# Patient Record
Sex: Male | Born: 1960 | Race: Black or African American | Hispanic: No | Marital: Married | Smoking: Never smoker
Health system: Southern US, Community
[De-identification: ages and names within clinical notes are randomized; demographics above are authoritative.]

## PROBLEM LIST (undated history)

## (undated) DIAGNOSIS — I82409 Acute embolism and thrombosis of unspecified deep veins of unspecified lower extremity: Secondary | ICD-10-CM

## (undated) DIAGNOSIS — M2140 Flat foot [pes planus] (acquired), unspecified foot: Secondary | ICD-10-CM

## (undated) DIAGNOSIS — I10 Essential (primary) hypertension: Secondary | ICD-10-CM

## (undated) HISTORY — DX: Essential (primary) hypertension: I10

## (undated) HISTORY — DX: Flat foot (pes planus) (acquired), unspecified foot: M21.40

## (undated) HISTORY — DX: Acute embolism and thrombosis of unspecified deep veins of unspecified lower extremity: I82.409

## (undated) HISTORY — PX: HALLUX VALGUS CORRECTION: SUR315

---

## 1986-01-12 HISTORY — PX: OTHER SURGICAL HISTORY: SHX169

## 1997-01-12 HISTORY — PX: KNEE ARTHROSCOPY: SUR90

## 2010-06-18 ENCOUNTER — Ambulatory Visit: Payer: Self-pay | Admitting: Internal Medicine

## 2012-07-04 DIAGNOSIS — M549 Dorsalgia, unspecified: Secondary | ICD-10-CM | POA: Insufficient documentation

## 2012-07-04 DIAGNOSIS — I10 Essential (primary) hypertension: Secondary | ICD-10-CM | POA: Insufficient documentation

## 2013-02-14 HISTORY — PX: OTHER SURGICAL HISTORY: SHX169

## 2015-04-18 ENCOUNTER — Other Ambulatory Visit: Payer: Self-pay | Admitting: Family Medicine

## 2015-04-18 ENCOUNTER — Ambulatory Visit
Admission: RE | Admit: 2015-04-18 | Discharge: 2015-04-18 | Disposition: A | Discharge status: Home / Self Care | Payer: PRIVATE HEALTH INSURANCE | Source: Ambulatory Visit | Attending: Family Medicine | Admitting: Family Medicine

## 2015-04-18 DIAGNOSIS — R52 Pain, unspecified: Secondary | ICD-10-CM

## 2015-04-18 DIAGNOSIS — M7989 Other specified soft tissue disorders: Secondary | ICD-10-CM

## 2015-04-18 DIAGNOSIS — I82431 Acute embolism and thrombosis of right popliteal vein: Secondary | ICD-10-CM | POA: Diagnosis not present

## 2015-04-30 ENCOUNTER — Ambulatory Visit
Admission: RE | Admit: 2015-04-30 | Discharge: 2015-04-30 | Disposition: A | Discharge status: Home / Self Care | Payer: PRIVATE HEALTH INSURANCE | Source: Ambulatory Visit | Attending: Family Medicine | Admitting: Family Medicine

## 2015-04-30 ENCOUNTER — Other Ambulatory Visit: Payer: Self-pay | Admitting: Family Medicine

## 2015-04-30 ENCOUNTER — Other Ambulatory Visit
Admission: RE | Admit: 2015-04-30 | Discharge: 2015-04-30 | Disposition: A | Discharge status: Home / Self Care | Payer: PRIVATE HEALTH INSURANCE | Source: Ambulatory Visit | Attending: Family Medicine | Admitting: Family Medicine

## 2015-04-30 DIAGNOSIS — I824Y1 Acute embolism and thrombosis of unspecified deep veins of right proximal lower extremity: Secondary | ICD-10-CM

## 2015-04-30 LAB — COMPREHENSIVE METABOLIC PANEL
ALBUMIN: 4.1 g/dL (ref 3.5–5.0)
ALK PHOS: 45 U/L (ref 38–126)
ALT: 25 U/L (ref 17–63)
ANION GAP: 6 (ref 5–15)
AST: 28 U/L (ref 15–41)
BUN: 13 mg/dL (ref 6–20)
CALCIUM: 8.6 mg/dL — AB (ref 8.9–10.3)
CO2: 25 mmol/L (ref 22–32)
Chloride: 102 mmol/L (ref 101–111)
Creatinine, Ser: 1.05 mg/dL (ref 0.61–1.24)
GFR calc Af Amer: >60 mL/min (ref >60)
GFR calc non Af Amer: >60 mL/min (ref >60)
GLUCOSE: 138 mg/dL — AB (ref 65–99)
Potassium: 2.9 mmol/L — CL (ref 3.5–5.1)
SODIUM: 133 mmol/L — AB (ref 135–145)
Total Bilirubin: 1 mg/dL (ref 0.3–1.2)
Total Protein: 7.7 g/dL (ref 6.5–8.1)

## 2015-04-30 LAB — CBC
HEMATOCRIT: 41.7 % (ref 40.0–52.0)
HEMOGLOBIN: 14.3 g/dL (ref 13.0–18.0)
MCH: 28.3 pg (ref 26.0–34.0)
MCHC: 34.3 g/dL (ref 32.0–36.0)
MCV: 82.4 fL (ref 80.0–100.0)
Platelets: 218 10*3/uL (ref 150–440)
RBC: 5.06 MIL/uL (ref 4.40–5.90)
RDW: 13.3 % (ref 11.5–14.5)
WBC: 5.7 10*3/uL (ref 3.8–10.6)

## 2015-04-30 LAB — LIPID PANEL
CHOL/HDL RATIO: 3.8 ratio
Cholesterol: 151 mg/dL (ref 0–200)
HDL: 40 mg/dL — AB (ref >40)
LDL CALC: 103 mg/dL — AB (ref 0–99)
Triglycerides: 40 mg/dL (ref <150)
VLDL: 8 mg/dL (ref 0–40)

## 2015-05-06 LAB — PROTHROMBIN GENE MUTATION

## 2015-05-06 LAB — ANTIPHOSPHOLIPID SYNDROME PROF
Anticardiolipin IgG: <9 GPL U/mL (ref 0–14)
DRVVT: 83.4 s — AB (ref 0.0–44.0)
PTT LA: 39 s (ref 0.0–43.6)

## 2015-05-06 LAB — FACTOR 5 LEIDEN

## 2015-05-06 LAB — ANA W/REFLEX IF POSITIVE: Anti Nuclear Antibody(ANA): NEGATIVE

## 2015-05-06 LAB — DRVVT CONFIRM: dRVVT Confirm: 1.5 ratio — ABNORMAL HIGH (ref 0.8–1.2)

## 2015-05-06 LAB — DRVVT MIX: DRVVT MIX: 59.3 s — AB (ref 0.0–44.0)

## 2015-09-30 ENCOUNTER — Encounter (INDEPENDENT_AMBULATORY_CARE_PROVIDER_SITE_OTHER): Payer: Self-pay

## 2015-09-30 ENCOUNTER — Inpatient Hospital Stay: Payer: PRIVATE HEALTH INSURANCE | Attending: Internal Medicine | Admitting: Internal Medicine

## 2015-09-30 DIAGNOSIS — Z7901 Long term (current) use of anticoagulants: Secondary | ICD-10-CM | POA: Diagnosis not present

## 2015-09-30 DIAGNOSIS — Z7982 Long term (current) use of aspirin: Secondary | ICD-10-CM

## 2015-09-30 DIAGNOSIS — D6851 Activated protein C resistance: Secondary | ICD-10-CM | POA: Diagnosis not present

## 2015-09-30 DIAGNOSIS — Z86718 Personal history of other venous thrombosis and embolism: Secondary | ICD-10-CM | POA: Diagnosis not present

## 2015-09-30 DIAGNOSIS — Z79899 Other long term (current) drug therapy: Secondary | ICD-10-CM | POA: Diagnosis not present

## 2015-09-30 DIAGNOSIS — I10 Essential (primary) hypertension: Secondary | ICD-10-CM

## 2015-09-30 DIAGNOSIS — I82431 Acute embolism and thrombosis of right popliteal vein: Secondary | ICD-10-CM | POA: Insufficient documentation

## 2015-09-30 DIAGNOSIS — I82401 Acute embolism and thrombosis of unspecified deep veins of right lower extremity: Secondary | ICD-10-CM | POA: Insufficient documentation

## 2015-09-30 NOTE — Progress Notes (Signed)
Patient ambulates without assistance, brought to exam 17.  Patient denies pain or discomfort at this time.  BP 162/81 HR 69, vitals documented

## 2015-09-30 NOTE — Progress Notes (Signed)
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     HISTORY OF PRESENTING ILLNESS:  Brandon Carr 55 y.o.  male with no prior history of blood clots a family history of blood clots- noted to have swelling and pain in his right lower extremity- April 2017 ultrasound of the lower leg showed DVT right popliteal/femoral vein. Patient was treated with Xarelto for about 3 months. He has been referred to [place] for an abnormal antiphospholipid antibody testing.  Patient does not cite any immobility risk factors relating to the DVT.  ROS: A complete 10 point review of system is done which is negative except mentioned above in history of present illness  MEDICAL HISTORY:  Past Medical History:  Diagnosis Date  . Acute DVT (deep venous thrombosis) (HCC)   . Hypertension   . Pes planus     SURGICAL HISTORY: Past Surgical History:  Procedure Laterality Date  . COLONOSCOPY W/BIOPSY  02/14/2013   Procedure: SCREENING COLONOSCOPY; Surgeon: Jonny Ruiz, MD; Location: [facility]; Service: Gastroenterology; Laterality:  . HALLUX VALGUS CORRECTION Left    Now with residual numbness at the lerft great toe  . KNEE ARTHROSCOPY Left 1999  . REPAIR EXTENSOR TENDON FOOT Right 1988    SOCIAL HISTORY: never smoked; no alcohol; desk job/ware house; no alcohol; in [place].  Social History   Social History  . Marital status: Married    Spouse name: N/A  . Number of children: N/A  . Years of education: N/A   Occupational History  . Not on file.   Social History Main Topics  . Smoking status: Never Smoker  . Smokeless tobacco: Never Used  . Alcohol use No  . Drug use: No  . Sexual activity: Not on file   Other Topics Concern   . Not on file   Social History Narrative  . No narrative on file    FAMILY HISTORY: No family hx of blood clots; sister-at ~ mid 40 breast cancer.  Family History  Problem Relation Age of Onset  . Stroke Father   . Breast cancer Sister     ALLERGIES:  has no allergies on file.  MEDICATIONS:  Current Outpatient Prescriptions  Medication Sig Dispense Refill  . losartan (COZAAR) 25 MG tablet 25 mg daily.    . meloxicam (MOBIC) 15 MG tablet Take 15 mg by mouth daily.    Marland Kitchen aspirin EC 81 MG tablet Take 81 mg by mouth.     No current facility-administered medications for this visit.       Marland Kitchen  PHYSICAL EXAMINATION: ECOG PERFORMANCE STATUS: 0 - Asymptomatic  Vitals:   09/30/15 1436  BP: (!) 162/81  Pulse: 69  Temp: 98.5 F (36.9 C)   Filed Weights   09/30/15 1436  Weight: 264 lb 6.4 oz (119.9 kg)    GENERAL: Well-nourished well-developed; Alert, no distress and comfortable.   Alone.  EYES: no pallor or icterus OROPHARYNX: no thrush or ulceration; good dentition  NECK: supple, no masses felt LYMPH:  no palpable lymphadenopathy in the cervical, axillary or inguinal regions LUNGS: clear to auscultation and  No wheeze or crackles HEART/CVS: regular rate & rhythm and no murmurs; Right claf slight more swollen than the left lower extremity.  ABDOMEN:  abdomen soft, non-tender and normal bowel sounds Musculoskeletal:no cyanosis of digits and no clubbing  PSYCH: alert & oriented x 3 with fluent speech NEURO: no focal motor/sensory deficits SKIN:  no rashes or significant lesions  LABORATORY DATA:  I have reviewed the data as listed Lab Results  Component Value Date   WBC 5.7 04/30/2015   HGB 14.3 04/30/2015   HCT 41.7 04/30/2015   MCV 82.4 04/30/2015   PLT 218 04/30/2015    Recent Labs  04/30/15 0811  NA 133*  K 2.9*  CL 102  CO2 25  GLUCOSE 138*  BUN 13  CREATININE 1.05  CALCIUM 8.6*  GFRNONAA >60  GFRAA >60  PROT 7.7  ALBUMIN 4.1  AST 28  ALT 25   ALKPHOS 45  BILITOT 1.0    RADIOGRAPHIC STUDIES: I have personally reviewed the radiological images as listed and agreed with the findings in the report. No results found.  ASSESSMENT & PLAN:   Acute deep vein thrombosis (DVT) of popliteal vein of right lower extremity (HCC) Acute DVT of the right lower extremity status post 3 months of anticoagulation [finished approximately July 2017]. Clinically no evidence of recurrence/progression. No evidence of any post thrombophilic syndrome. However given the elevated d-dimer off anticoagulation; I would recommend a total of 6 months of anticoagulation. This was personally discussed with Dr. Ellin Goodieabinowitz.   # Recommend follow-up with PCP; no further follow-ups at this time.   Thank you Dr. Ellin Goodieabinowitz for allowing me to participate in the care of your pleasant patient. Please do not hesitate to contact me with questions or concerns in the interim.  All questions were answered. The patient knows to call the clinic with any problems, questions or concerns.     Earna CoderGovinda R Brahmanday, MD 10/01/2015 7:58 PM

## 2015-09-30 NOTE — Assessment & Plan Note (Addendum)
Acute DVT of the right lower extremity status post 3 months of anticoagulation [finished approximately July 2017]. Clinically no evidence of recurrence/progression. No evidence of any post thrombophilic syndrome. However given the elevated d-dimer off anticoagulation; I would recommend a total of 6 months of anticoagulation. This was personally discussed with Dr. Ellin Goodieabinowitz.   # Abnormal antiphospholipid antibody testing on anticoagulation; repeat testing And decortication within normal limits. The previous test was likely a false positive.   # Recommend follow-up with PCP; no further follow-ups at this time.   Thank you Dr. Ellin Goodieabinowitz for allowing me to participate in the care of your pleasant patient. Please do not hesitate to contact me with questions or concerns in the interim.

## 2015-10-01 ENCOUNTER — Telehealth: Payer: Self-pay | Admitting: Internal Medicine

## 2015-10-01 ENCOUNTER — Encounter: Payer: Self-pay | Admitting: *Deleted

## 2015-10-01 NOTE — Telephone Encounter (Signed)
Heather- please inform patient that I have spoken to his PCP Dr.rabinowitz; recommend 3 more months of Xarelto [total of 6 months]. To call his PCPs office for a refill if needed office for xarelto. Stop aspirin while on Xarelto. Recommend continued follow up with PCP; no follow-up with us.

## 2015-10-02 NOTE — Telephone Encounter (Signed)
Attempted to contact patient- no answer. Left vm to call cancer ctr.

## 2015-10-02 NOTE — Telephone Encounter (Signed)
Pt returned Brandon Treptow,Rn's phone call. instructions provided to patient. pcp has already touched base with patient and provided recommendation.

## 2015-10-02 NOTE — Telephone Encounter (Signed)
Attempted to call patient again. No answer. Left vm for patient to call cancer center to discuss his care with RN

## 2017-02-28 IMAGING — CR DG CHEST 2V
1 series · 2 of 2 positions shown · non-contrast
Comparison: 05/22/2003 by report only

CLINICAL DATA: Pt recently diagnosed with DVT of right leg, [REDACTED] to rule out mass, nonsmoker, no heart and lung surgery

EXAM:
CHEST - 2 VIEW

[Series 1: dg chest 2 view · 0.14mm/px · 2 of 2 slices shown]
[im 1/2]
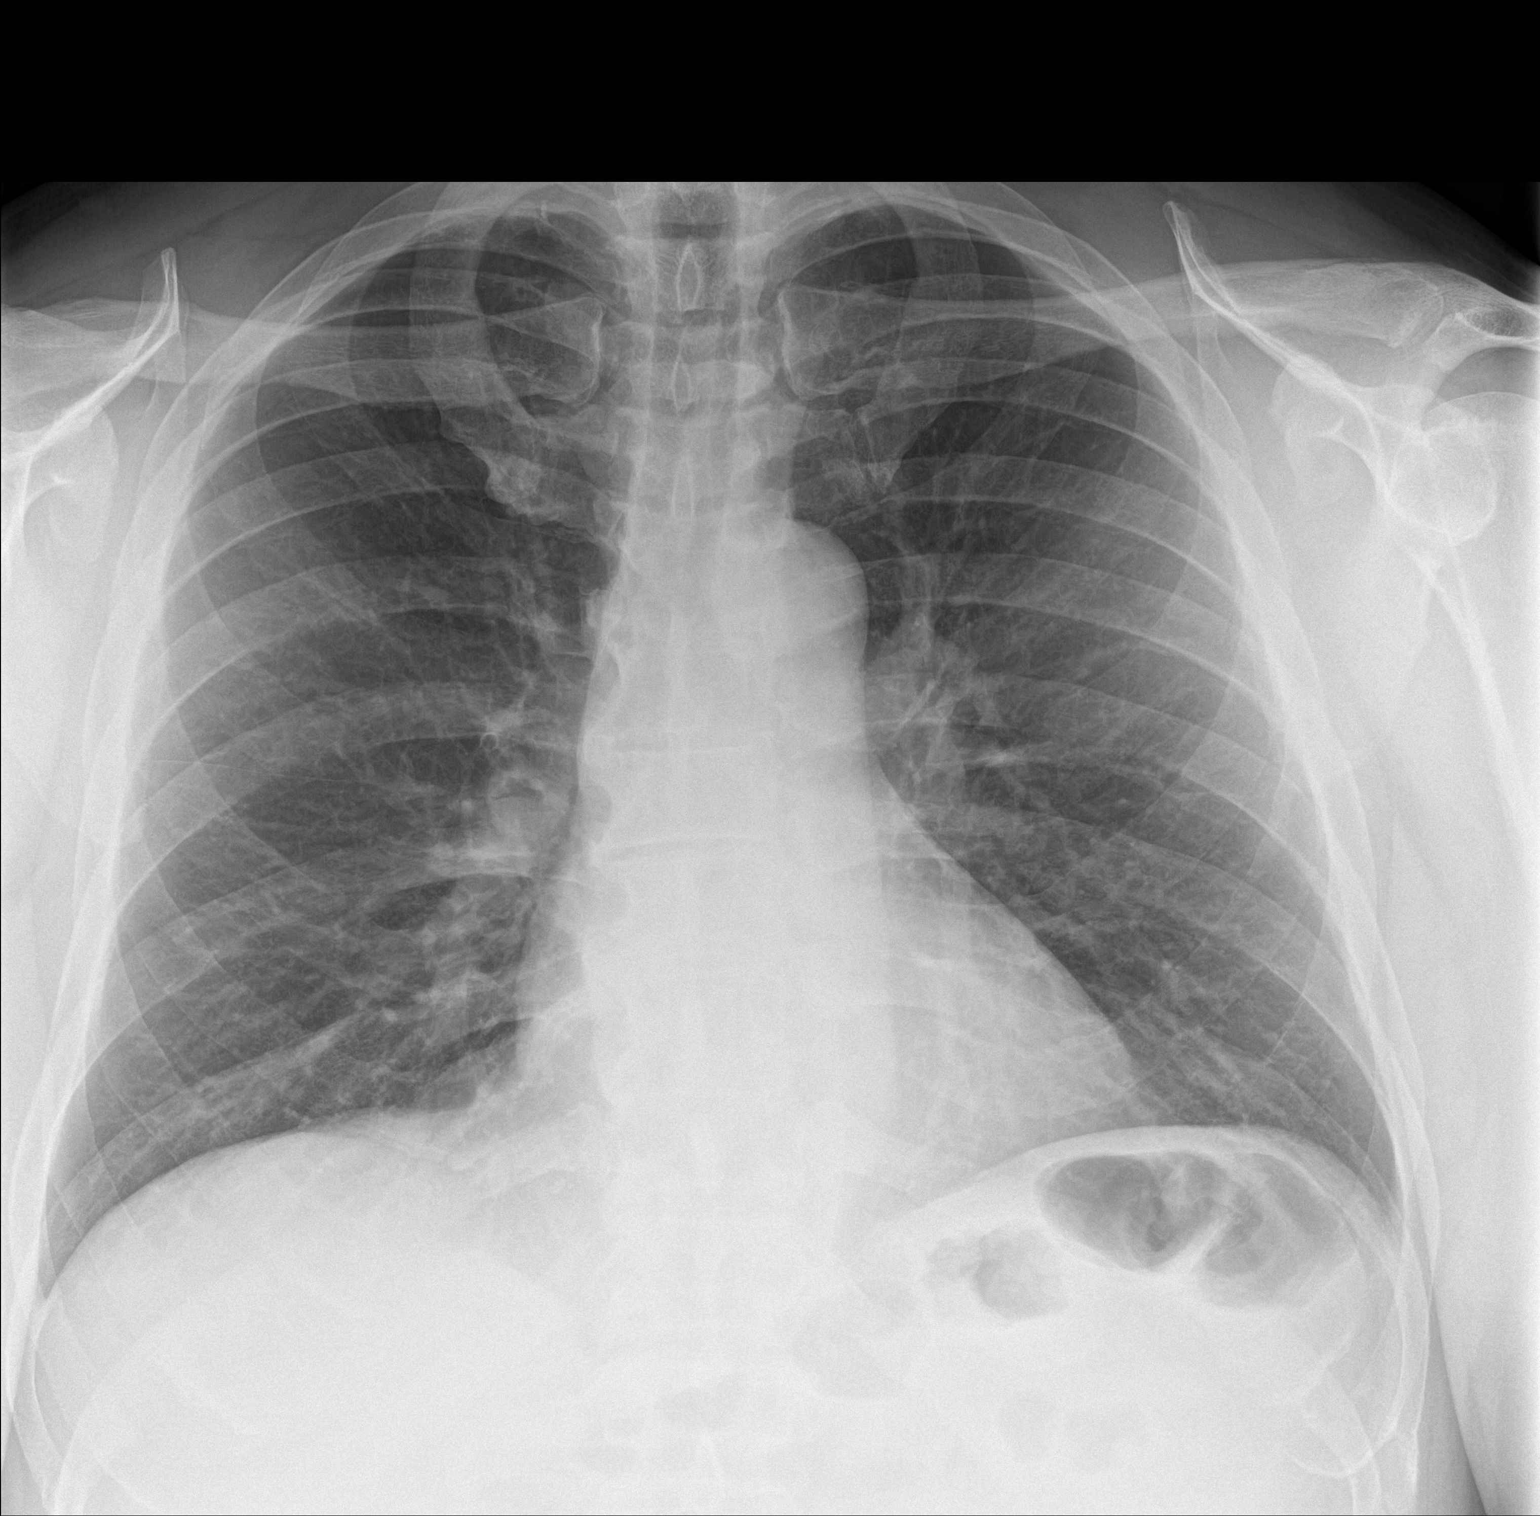
[im 2/2]
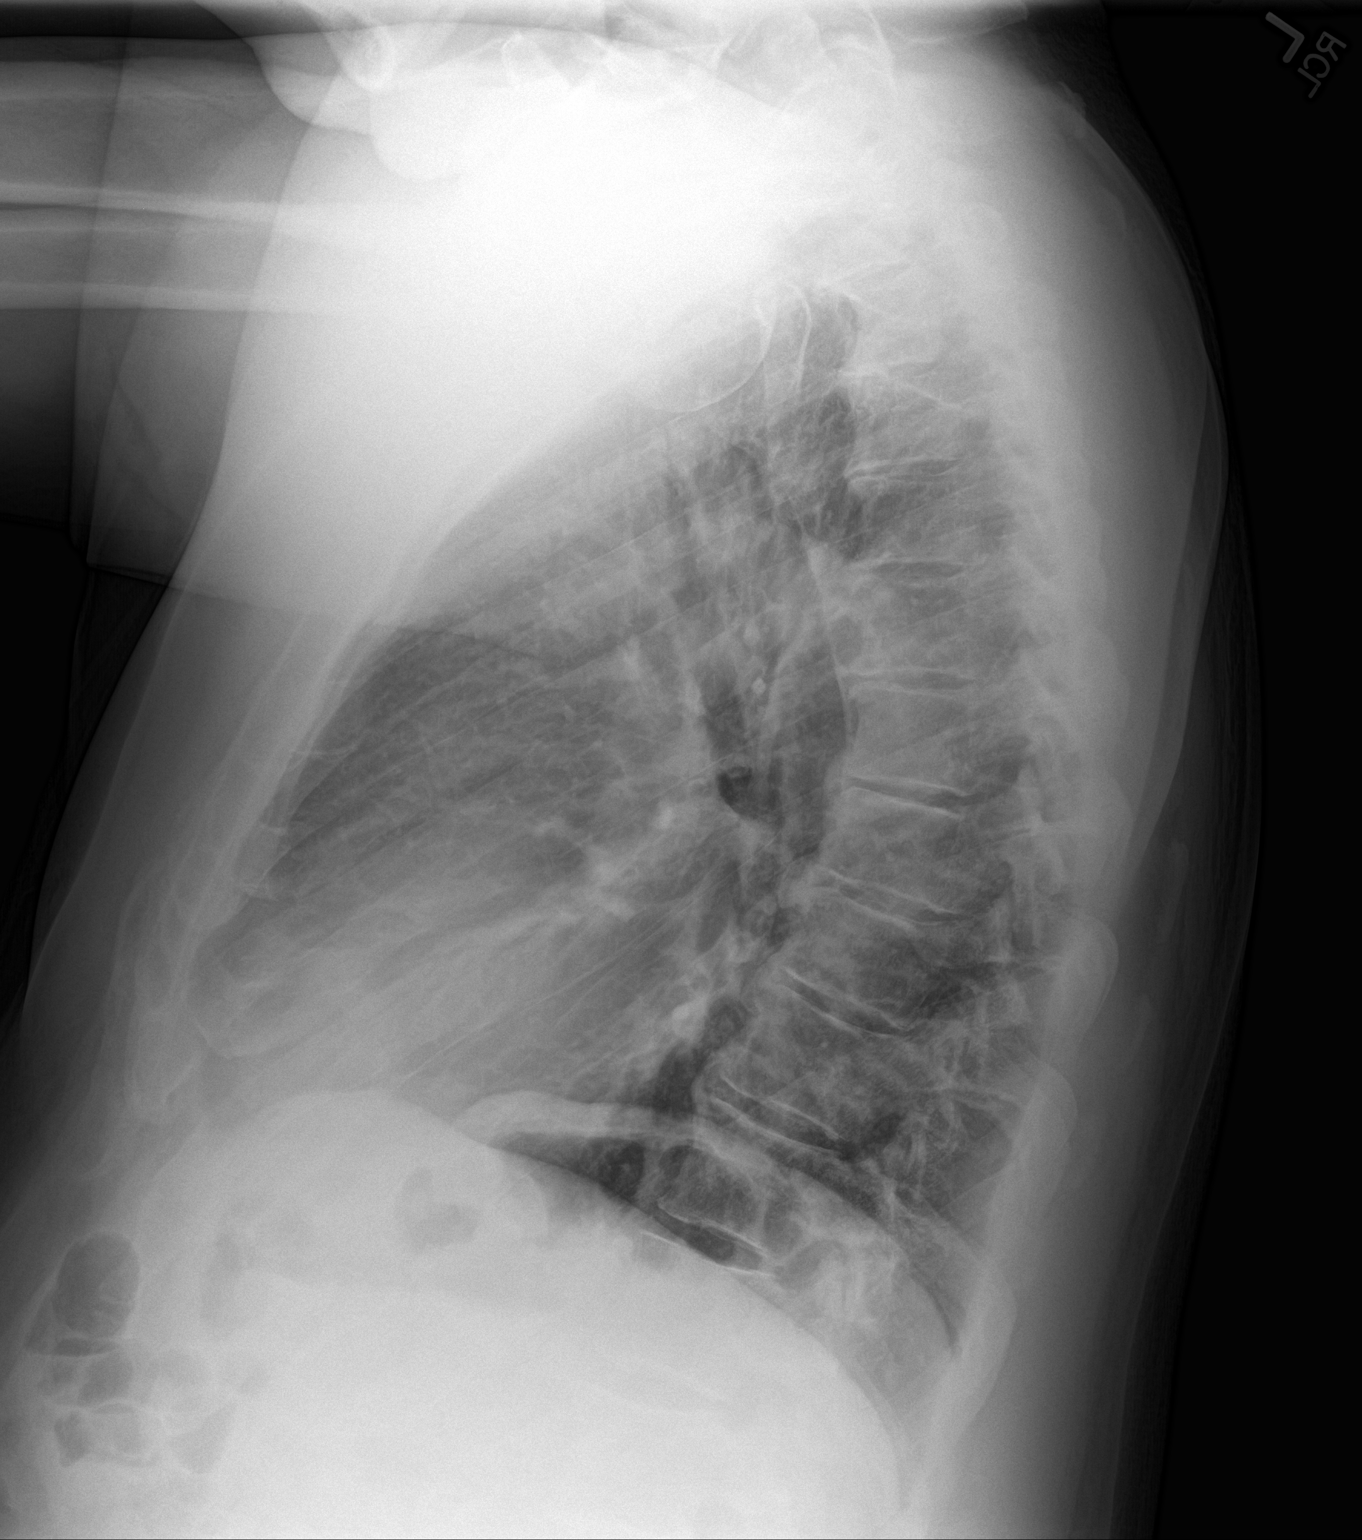

[2 of 2 positions shown; findings below may reference images not displayed]

FINDINGS: Lungs are clear. Heart size and mediastinal contours are within
normal limits.
No effusion.  No pneumothorax.
Spurring in the mid and lower thoracic spine.
IMPRESSION: No acute cardiopulmonary disease.

## 2018-07-21 ENCOUNTER — Other Ambulatory Visit: Payer: Self-pay

## 2018-07-21 ENCOUNTER — Encounter: Payer: Self-pay | Admitting: Emergency Medicine

## 2018-07-21 ENCOUNTER — Emergency Department
Admission: EM | Admit: 2018-07-21 | Discharge: 2018-07-21 | Disposition: A | Discharge status: Home / Self Care | Payer: PRIVATE HEALTH INSURANCE | Attending: Emergency Medicine | Admitting: Emergency Medicine

## 2018-07-21 DIAGNOSIS — Z7982 Long term (current) use of aspirin: Secondary | ICD-10-CM | POA: Insufficient documentation

## 2018-07-21 DIAGNOSIS — R739 Hyperglycemia, unspecified: Secondary | ICD-10-CM | POA: Insufficient documentation

## 2018-07-21 DIAGNOSIS — Z79899 Other long term (current) drug therapy: Secondary | ICD-10-CM | POA: Insufficient documentation

## 2018-07-21 DIAGNOSIS — I10 Essential (primary) hypertension: Secondary | ICD-10-CM | POA: Insufficient documentation

## 2018-07-21 LAB — CBC
HCT: 41.6 % (ref 39.0–52.0)
Hemoglobin: 14.3 g/dL (ref 13.0–17.0)
MCH: 28 pg (ref 26.0–34.0)
MCHC: 34.4 g/dL (ref 30.0–36.0)
MCV: 81.6 fL (ref 80.0–100.0)
Platelets: 266 10*3/uL (ref 150–400)
RBC: 5.1 MIL/uL (ref 4.22–5.81)
RDW: 12.1 % (ref 11.5–15.5)
WBC: 6.6 10*3/uL (ref 4.0–10.5)
nRBC: 0 % (ref 0.0–0.2)

## 2018-07-21 LAB — URINALYSIS, COMPLETE (UACMP) WITH MICROSCOPIC
Bacteria, UA: NONE SEEN
Bilirubin Urine: NEGATIVE
Glucose, UA: >=500 mg/dL — AB
Hgb urine dipstick: NEGATIVE
Ketones, ur: NEGATIVE mg/dL
Leukocytes,Ua: NEGATIVE
Nitrite: NEGATIVE
Protein, ur: NEGATIVE mg/dL
Specific Gravity, Urine: 1.028 (ref 1.005–1.030)
Squamous Epithelial / LPF: NONE SEEN (ref 0–5)
pH: 6 (ref 5.0–8.0)

## 2018-07-21 LAB — BASIC METABOLIC PANEL
Anion gap: 14 (ref 5–15)
BUN: 26 mg/dL — ABNORMAL HIGH (ref 6–20)
CO2: 24 mmol/L (ref 22–32)
Calcium: 9.6 mg/dL (ref 8.9–10.3)
Chloride: 92 mmol/L — ABNORMAL LOW (ref 98–111)
Creatinine, Ser: 1.43 mg/dL — ABNORMAL HIGH (ref 0.61–1.24)
GFR calc Af Amer: >60 mL/min (ref >60)
GFR calc non Af Amer: 54 mL/min — ABNORMAL LOW (ref >60)
Glucose, Bld: 765 mg/dL (ref 70–99)
Potassium: 4.1 mmol/L (ref 3.5–5.1)
Sodium: 130 mmol/L — ABNORMAL LOW (ref 135–145)

## 2018-07-21 LAB — GLUCOSE, CAPILLARY
Glucose-Capillary: 251 mg/dL — ABNORMAL HIGH (ref 70–99)
Glucose-Capillary: 352 mg/dL — ABNORMAL HIGH (ref 70–99)
Glucose-Capillary: >600 mg/dL (ref 70–99)

## 2018-07-21 MED ORDER — SODIUM CHLORIDE 0.9 % IV BOLUS
1000.0000 mL | Freq: Once | INTRAVENOUS | Status: AC
  Administered 2018-07-21: 17:00:00 1000 mL via INTRAVENOUS

## 2018-07-21 MED ORDER — SODIUM CHLORIDE 0.9 % IV BOLUS
1000.0000 mL | Freq: Once | INTRAVENOUS | Status: AC
  Administered 2018-07-21: 19:00:00 1000 mL via INTRAVENOUS

## 2018-07-21 MED ORDER — SODIUM CHLORIDE 0.9 % IV BOLUS
1000.0000 mL | Freq: Once | INTRAVENOUS | Status: AC
  Administered 2018-07-21: 1000 mL via INTRAVENOUS

## 2018-07-21 MED ORDER — METFORMIN HCL 500 MG PO TABS: 500.0000 mg | ORAL_TABLET | Freq: Two times a day (BID) | ORAL | 0 refills | Status: AC

## 2018-07-21 MED ORDER — INSULIN ASPART 100 UNIT/ML ~~LOC~~ SOLN
10.0000 [IU] | Freq: Once | SUBCUTANEOUS | Status: AC
  Administered 2018-07-21: 19:00:00 10 [IU] via INTRAVENOUS
  Filled 2018-07-21: qty 1

## 2018-07-21 MED ORDER — INSULIN ASPART 100 UNIT/ML ~~LOC~~ SOLN
6.0000 [IU] | Freq: Once | SUBCUTANEOUS | Status: AC
  Administered 2018-07-21: 23:00:00 6 [IU] via INTRAVENOUS
  Filled 2018-07-21: qty 1

## 2018-07-21 NOTE — ED Provider Notes (Signed)
[facility] Emergency Department Provider Note ____________________________________________   First MD Initiated Contact with Patient 07/21/18 1837     (approximate)  I have reviewed the triage vital signs and the nursing notes.   HISTORY  Chief Complaint Weakness and Hyperglycemia    HPI Brandon Carr is a 58 y.o. male with PMH as noted below who presents with hyperglycemia, unknown onset, measured high on a fingerstick that his coworker checked for him today.  The patient reports he has had increased generalized fatigue over about the last week, as well as increased thirst, dry mouth, and polyuria.  He denies fever, vomiting or diarrhea, or any chest pain or shortness of breath.  He has no known history of diabetes.  Past Medical History:  Diagnosis Date  . Acute DVT (deep venous thrombosis) (HCC)   . Hypertension   . Pes planus     Patient Active Problem List   Diagnosis Date Noted  . Right leg DVT (HCC) 09/30/2015  . Acute deep vein thrombosis (DVT) of popliteal vein of right lower extremity (HCC) 09/30/2015    Past Surgical History:  Procedure Laterality Date  . COLONOSCOPY W/BIOPSY  02/14/2013   Procedure: SCREENING COLONOSCOPY; Surgeon: Jonny Ruizebecca Ann Burbridge, MD; Location: [facility]; Service: Gastroenterology; Laterality:  . HALLUX VALGUS CORRECTION Left    Now with residual numbness at the lerft great toe  . KNEE ARTHROSCOPY Left 1999  . REPAIR EXTENSOR TENDON FOOT Right 1988    Prior to Admission medications   Medication Sig Start Date End Date Taking? Authorizing Provider  aspirin EC 81 MG tablet Take 81 mg by mouth.    [provider]  hydrochlorothiazide (HYDRODIURIL) 25 MG tablet Take 25 mg by mouth every morning. 05/11/18   [provider]  losartan (COZAAR) 25 MG tablet 25 mg daily. 10/30/13   [provider]  meloxicam (MOBIC) 15 MG tablet Take 15 mg by mouth daily.    [provider]  metFORMIN (GLUCOPHAGE) 500 MG tablet Take 1 tablet (500 mg total) by mouth 2 (two) times daily with a meal. 07/21/18 08/20/18  Dionne BucySiadecki, Caydence Enck, MD  potassium chloride SA (K-DUR) 20 MEQ tablet Take 20 mEq by mouth every morning. 05/11/18   [provider]    Allergies Patient has no known allergies.  Family History  Problem Relation Age of Onset  . Stroke Father   . Breast cancer Sister     Social History Social History   Tobacco Use  . Smoking status: Never Smoker  . Smokeless tobacco: Never Used  Substance Use Topics  . Alcohol use: No  . Drug use: No    Review of Systems  Constitutional: No fever.  Positive for fatigue. Eyes: No redness. ENT: No sore throat. Cardiovascular: Denies chest pain. Respiratory: Denies shortness of breath. Gastrointestinal: No vomiting or diarrhea.  Genitourinary: Positive for polyuria.  Musculoskeletal: Negative for back pain. Skin: Negative for rash. Neurological: Negative for headache.   ____________________________________________   PHYSICAL EXAM:  VITAL SIGNS: ED Triage Vitals  Enc Vitals Group     BP 07/21/18 1621 (!) 160/81     Pulse Rate 07/21/18 1621 (!) 105     Resp 07/21/18 1621 18     Temp 07/21/18 1621 99.4 F (37.4 C)     Temp src --      SpO2 07/21/18 1621 100 %     Weight 07/21/18 1622 262 lb (118.8 kg)     Height 07/21/18 1622 6\' 3"  (  1.905 m)     Head Circumference --      Peak Flow --      Pain Score 07/21/18 1622 0     Pain Loc --      Pain Edu? --      Excl. in [place]? --     Constitutional: Alert and oriented. Well appearing and in no acute distress. Eyes: Conjunctivae are normal.  Head: Atraumatic. Nose: No congestion/rhinnorhea. Mouth/Throat: Mucous membranes are slightly dry. Neck: Normal range of motion.  Cardiovascular: Good peripheral circulation. Respiratory: Normal respiratory effort.   Gastrointestinal: No distention.  Musculoskeletal:  Extremities warm and well perfused.   Neurologic:  Normal speech and language. No gross focal neurologic deficits are appreciated.  Skin:  Skin is warm and dry. No rash noted. Psychiatric: Mood and affect are normal. Speech and behavior are normal.  ____________________________________________   LABS (all labs ordered are listed, but only abnormal results are displayed)  Labs Reviewed  BASIC METABOLIC PANEL - Abnormal; Notable for the following components:      Result Value   Sodium 130 (*)    Chloride 92 (*)    Glucose, Bld 765 (*)    BUN 26 (*)    Creatinine, Ser 1.43 (*)    GFR calc non Af Amer 54 (*)    All other components within normal limits  URINALYSIS, COMPLETE (UACMP) WITH MICROSCOPIC - Abnormal; Notable for the following components:   Color, Urine STRAW (*)    APPearance CLEAR (*)    Glucose, UA >=500 (*)    All other components within normal limits  GLUCOSE, CAPILLARY - Abnormal; Notable for the following components:   Glucose-Capillary >600 (*)    All other components within normal limits  GLUCOSE, CAPILLARY - Abnormal; Notable for the following components:   Glucose-Capillary 352 (*)    All other components within normal limits  GLUCOSE, CAPILLARY - Abnormal; Notable for the following components:   Glucose-Capillary 251 (*)    All other components within normal limits  CBC  OSMOLALITY  CBG MONITORING, ED  CBG MONITORING, ED   ____________________________________________  EKG   ____________________________________________  RADIOLOGY    ____________________________________________   PROCEDURES  Procedure(s) performed: No  Procedures  Critical Care performed: No ____________________________________________   INITIAL IMPRESSION / ASSESSMENT AND PLAN / ED COURSE  Pertinent labs & imaging results that were available during my care of the patient were reviewed by me and considered in my medical decision making (see chart for details).  58 year old male with a history of  hypertension and other PMH as noted above (and no prior history of diabetes) presents with hyperglycemia, with about 1 week of generalized fatigue as well as increased thirst, dry mouth, and polyuria.  On exam, the patient is well-appearing.  His vital signs are normal except for elevated blood pressure and slight tachycardia.  The remainder of the exam is unremarkable.  Labs reveal glucose of 765 with normal electrolytes.  The patient does not have an elevated anion gap and has no ketones on his UA.  Overall presentation is consistent with new onset diabetes and hyperglycemia with no evidence of DKA.  The patient appears quite well and has a strong preference to go home if at all possible.  He does have a PMD he can follow-up with.  I think given that he does not have evidence of DKA, it would be reasonable to give IV insulin and fluids here and if we can get the glucose down significantly, he  may be appropriate for discharge home with oral diabetes medication and close outpatient follow-up.  ----------------------------------------- 12:25 AM on 07/22/2018 -----------------------------------------  After fluids and initial insulin, the glucose improved to 350.  I gave an additional dose of insulin and the glucose went down to 250.  The patient feels very well and continues to want to go home.  At this time, he is stable for discharge.  I prescribed metformin and instructed him to call his PMD tomorrow to arrange for follow-up as soon as possible.  I informed him that he likely has new onset diabetes and will need close follow-up.  I gave him thorough return precautions and he expressed understanding.  ____________________________________________   FINAL CLINICAL IMPRESSION(S) / ED DIAGNOSES  Final diagnoses:  Hyperglycemia      NEW MEDICATIONS STARTED DURING THIS VISIT:  Discharge Medication List as of 07/21/2018 11:44 PM    START taking these medications   Details  metFORMIN  (GLUCOPHAGE) 500 MG tablet Take 1 tablet (500 mg total) by mouth 2 (two) times daily with a meal., Starting Thu 07/21/2018, Until Sat 08/20/2018, Normal         Note:  This document was prepared using Dragon voice recognition software and may include unintentional dictation errors.    Dionne BucySiadecki, Aireonna Bauer, MD 07/22/18 [phone]

## 2018-07-21 NOTE — ED Triage Notes (Signed)
Patient states for the last 2 weeks he has noticed increased fatigue and weakness as well as frequent urination and dry mouth. Patient denies history of diabetes but states he had a coworker check his sugar at work and it came back reading "high". Patient denies N/V or dizziness. Denies recent illness.

## 2018-07-21 NOTE — Discharge Instructions (Signed)
Call your primary care doctor tomorrow to arrange for a follow-up as soon as possible.  Take the metformin as prescribed until you follow-up.  Return to the ER immediately if you have persistent or worsening frequent urination, increased thirst or dry mouth, weakness, or have another high blood sugar reading.

## 2018-07-21 NOTE — ED Notes (Signed)
PT given phone to call wife

## 2018-08-04 DIAGNOSIS — E7800 Pure hypercholesterolemia, unspecified: Secondary | ICD-10-CM | POA: Insufficient documentation

## 2018-08-04 DIAGNOSIS — E139 Other specified diabetes mellitus without complications: Secondary | ICD-10-CM | POA: Insufficient documentation

## 2018-08-04 DIAGNOSIS — E669 Obesity, unspecified: Secondary | ICD-10-CM | POA: Insufficient documentation

## 2019-10-25 DIAGNOSIS — M1990 Unspecified osteoarthritis, unspecified site: Secondary | ICD-10-CM | POA: Insufficient documentation

## 2019-10-25 DIAGNOSIS — M2140 Flat foot [pes planus] (acquired), unspecified foot: Secondary | ICD-10-CM | POA: Insufficient documentation
